# Patient Record
Sex: Male | Born: 1980 | Race: Black or African American | Hispanic: No | Marital: Married
Health system: Southern US, Community
[De-identification: ages and names within clinical notes are randomized; demographics above are authoritative.]

---

## 2003-10-25 ENCOUNTER — Ambulatory Visit (HOSPITAL_BASED_OUTPATIENT_CLINIC_OR_DEPARTMENT_OTHER): Admission: RE | Admit: 2003-10-25 | Discharge: 2003-10-25 | Payer: Self-pay | Admitting: Orthopaedic Surgery

## 2006-02-14 ENCOUNTER — Emergency Department (HOSPITAL_COMMUNITY): Admission: EM | Admit: 2006-02-14 | Discharge: 2006-02-14 | Payer: Self-pay | Admitting: Family Medicine

## 2006-12-20 ENCOUNTER — Emergency Department (HOSPITAL_COMMUNITY): Admission: EM | Admit: 2006-12-20 | Discharge: 2006-12-20 | Payer: Self-pay | Admitting: Emergency Medicine

## 2007-09-17 ENCOUNTER — Emergency Department (HOSPITAL_COMMUNITY): Admission: EM | Admit: 2007-09-17 | Discharge: 2007-09-17 | Payer: Self-pay | Admitting: Emergency Medicine

## 2010-05-18 ENCOUNTER — Encounter: Admission: RE | Admit: 2010-05-18 | Discharge: 2010-05-18 | Payer: Self-pay | Admitting: Family Medicine

## 2010-07-28 ENCOUNTER — Emergency Department (HOSPITAL_COMMUNITY): Admission: EM | Admit: 2010-07-28 | Discharge: 2010-07-28 | Payer: Self-pay | Admitting: Family Medicine

## 2010-09-03 ENCOUNTER — Emergency Department (HOSPITAL_COMMUNITY): Admission: EM | Admit: 2010-09-03 | Discharge: 2010-09-03 | Payer: Self-pay | Admitting: Emergency Medicine

## 2011-05-17 NOTE — Op Note (Signed)
NAME:  Peter Burke, Peter Burke NO.:  [id number]   MEDICAL RECORD NO.:  [id number]                   PATIENT TYPE:  AMB   LOCATION:  [facility]                                  FACILITY:  [facility]   PHYSICIAN:  Lubertha Basque. Jerl Santos, M.D.             DATE OF BIRTH:  10/07/1981   DATE OF PROCEDURE:  10/25/2003  DATE OF DISCHARGE:                                 OPERATIVE REPORT   PREOPERATIVE DIAGNOSIS:  Right foot Lisfranc injury.   POSTOPERATIVE DIAGNOSIS:  Right foot Lisfranc injury.   PROCEDURE:  Right foot closed reduction and pinning.   ANESTHESIA:  General.   ATTENDING SURGEON:  Lubertha Basque. Jerl Santos, M.D.   ASSISTANT:  Lindwood Qua, P.A.   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  He  has injured his mid-foot.  He has subluxation of metatarsals 3, 4, and 5 at  Lisfranc's joint, though metatarsals 2 and 1 appear to be stable.  There is  no associated fracture.  He is offered a closed reduction and pinning in  hopes of realigning his mid-foot and preventing further difficulty in this  portion of his foot.  Informed operative consent was obtained after  discussion of possible complications of reaction to anesthesia, infection,  and degenerative changes.   DESCRIPTION OF PROCEDURE:  The patient was taken to the operating suite  where general anesthetic was applied without difficulty.  He was positioned  supine and prepped and draped in the normal sterile fashion.  After  administration of preoperative IV antibiotics, the right foot was examined  under fluoroscopy.  He was found to have an instability again of metatarsals  3, 4, and 5 in a lateral direction.  This was reduced in an adequate  position and pinned in place percutaneously with two 0.062 K-wires.  These  were placed through the bases of metatarsals 4 and 5 into the mid-foot.  The  pins were  checked under fluoroscopy to be in appropriate position.  I read  all these views myself.  The pins were bent outside the skin and dressed  with Xeroform.  Dry gauze was applied followed by a posterior splint of  plaster.  Estimated blood loss and intraoperative fluids can be obtained  from the anesthesia records.  No tourniquet was placed.   DISPOSITION:  The patient was extubated in the operating room and taken to  the recovery room in stable condition.  Plans were for him to go home the  same day and to follow up in the office in less than a week.  I will contact  him by phone tonight.  Lubertha Basque Jerl Santos, M.D.    PGD/MEDQ  D:  10/25/2003  T:  10/25/2003  Job:  [id number]

## 2015-08-08 ENCOUNTER — Telehealth: Payer: Self-pay | Admitting: Family Medicine

## 2015-11-15 NOTE — Telephone Encounter (Signed)
Finished

## 2015-11-17 ENCOUNTER — Other Ambulatory Visit: Payer: Self-pay | Admitting: Family Medicine

## 2015-11-17 DIAGNOSIS — R1031 Right lower quadrant pain: Secondary | ICD-10-CM

## 2015-11-17 DIAGNOSIS — R197 Diarrhea, unspecified: Secondary | ICD-10-CM

## 2015-11-17 DIAGNOSIS — R110 Nausea: Secondary | ICD-10-CM

## 2015-11-27 ENCOUNTER — Ambulatory Visit
Admission: RE | Admit: 2015-11-27 | Discharge: 2015-11-27 | Disposition: A | Discharge status: Home / Self Care | Payer: BC Managed Care – PPO | Source: Ambulatory Visit | Attending: Family Medicine | Admitting: Family Medicine

## 2015-11-27 DIAGNOSIS — R110 Nausea: Secondary | ICD-10-CM

## 2015-11-27 DIAGNOSIS — R1031 Right lower quadrant pain: Secondary | ICD-10-CM

## 2015-11-27 DIAGNOSIS — R197 Diarrhea, unspecified: Secondary | ICD-10-CM

## 2016-07-10 IMAGING — US US ABDOMEN COMPLETE
1 series · 14 of 25 positions shown · non-contrast
Comparison: None impacts

CLINICAL DATA: Bilateral lower abdominal discomfort associated with
nausea vomiting and diarrhea for the past year

EXAM:
ULTRASOUND ABDOMEN COMPLETE

[Series 1: us abdomen complete · 0.20mm/px · 14 of 81 slices shown]
[im 1/81]
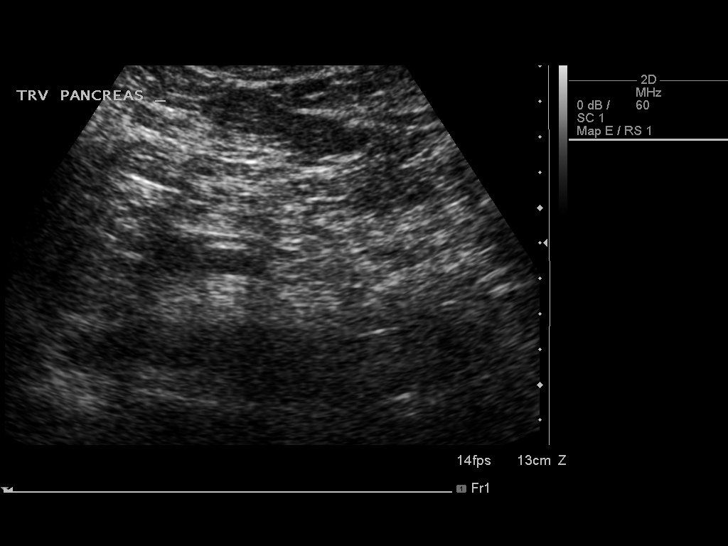
[im 7/81]
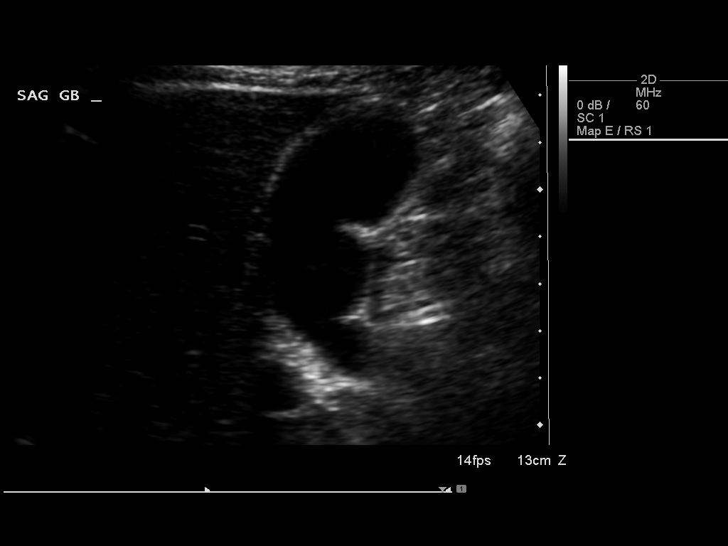
[im 14/81]
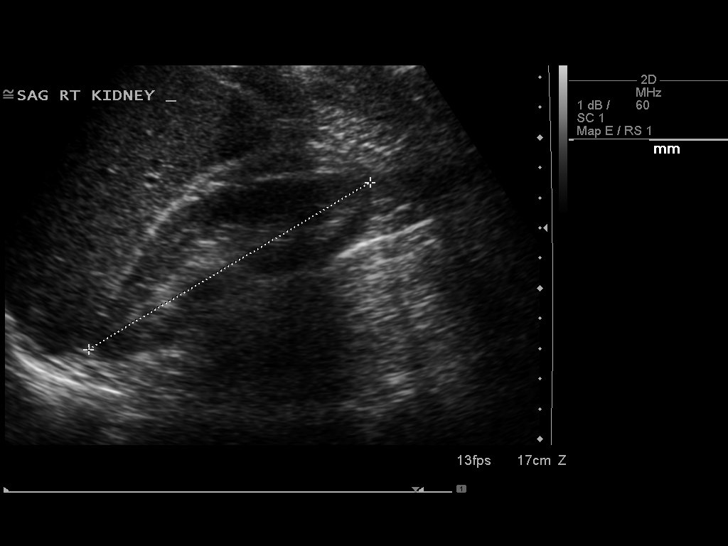
[im 21/81]
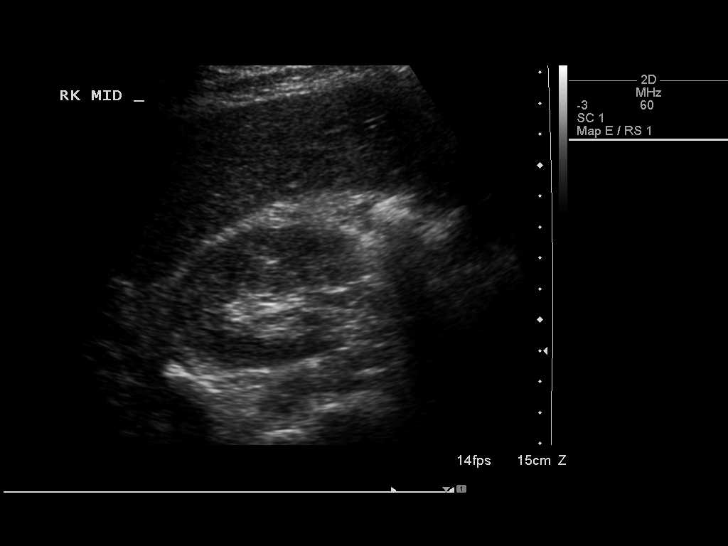
[im 27/81]
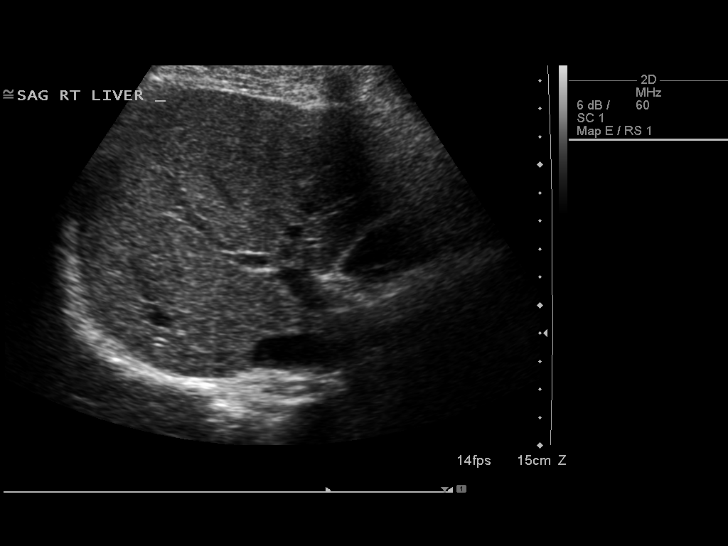
[im 31/81]
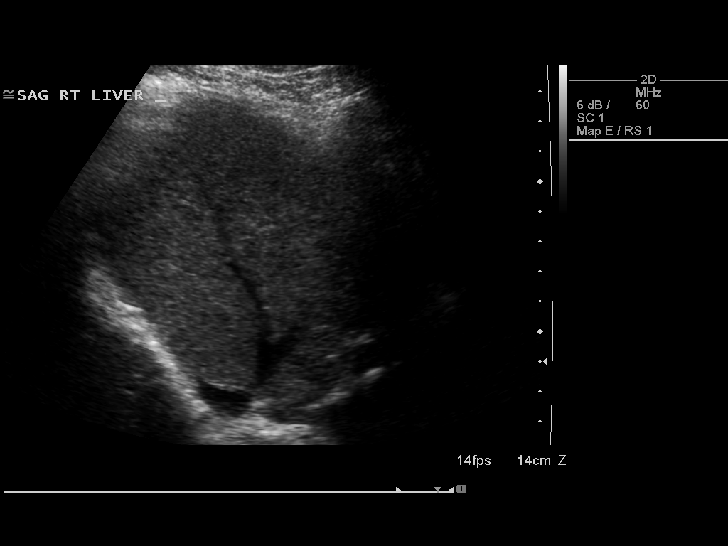
[im 37/81]
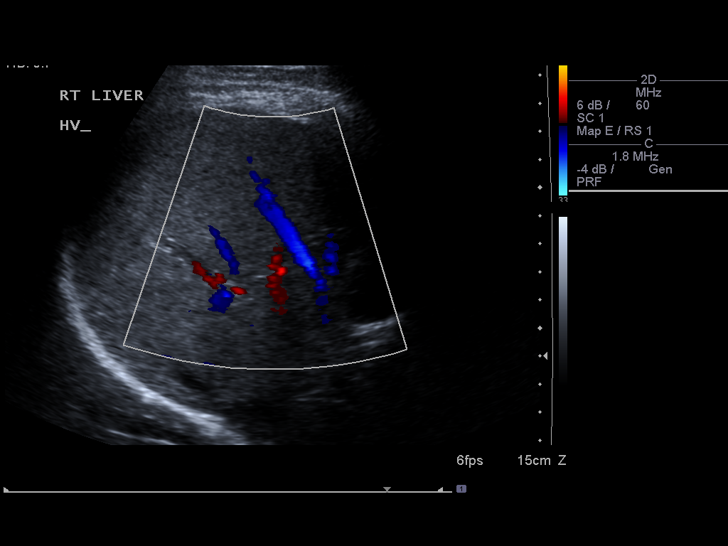
[im 44/81]
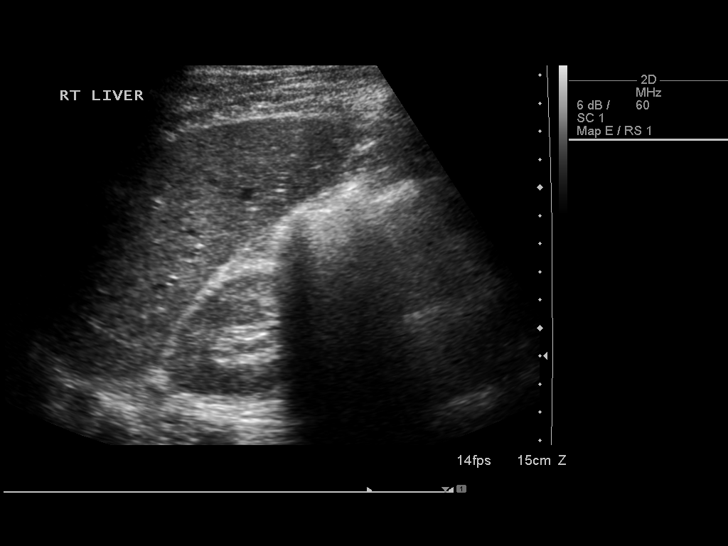
[im 51/81]
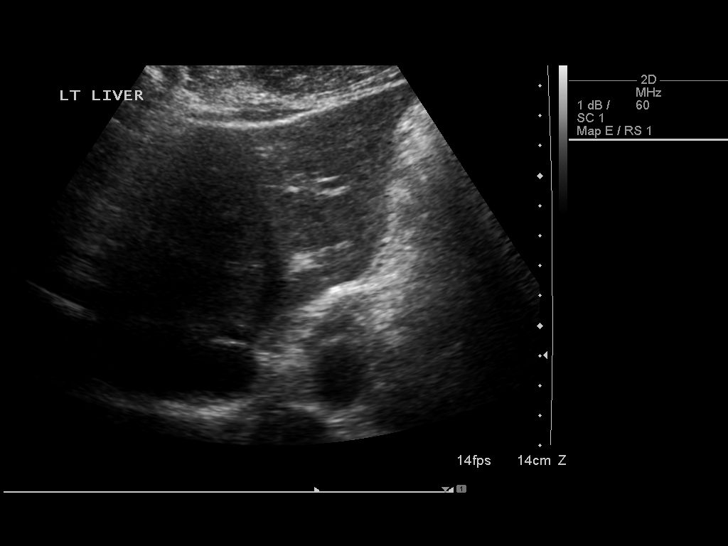
[im 54/81]
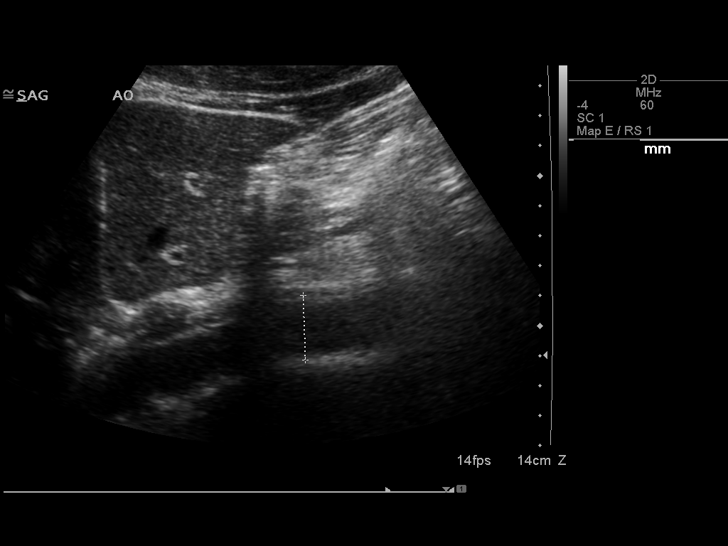
[im 61/81]
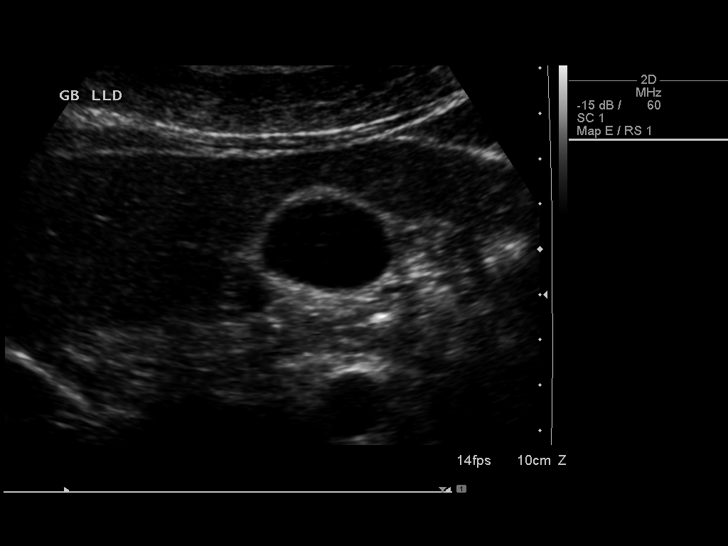
[im 67/81]
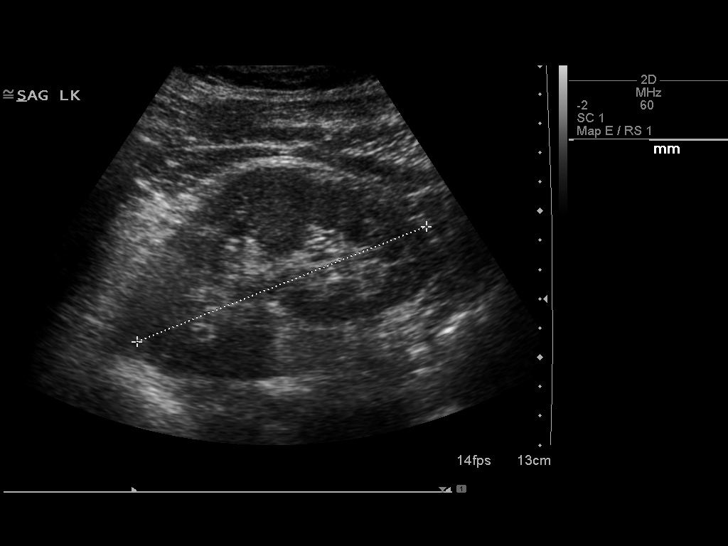
[im 74/81]
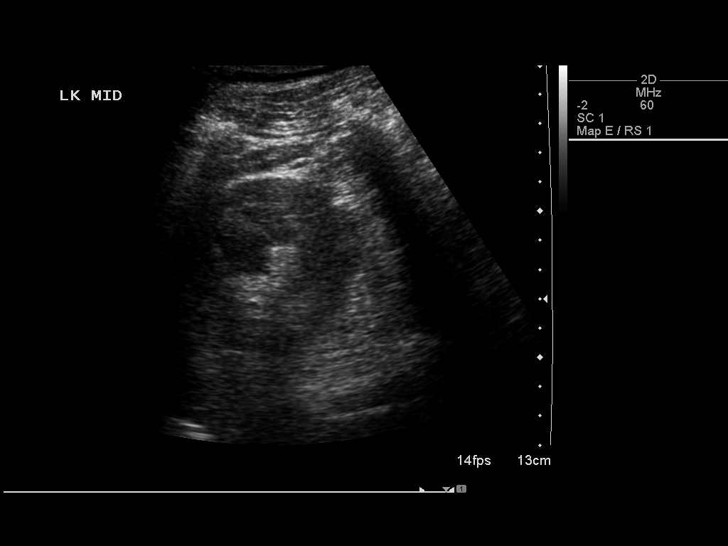
[im 81/81]
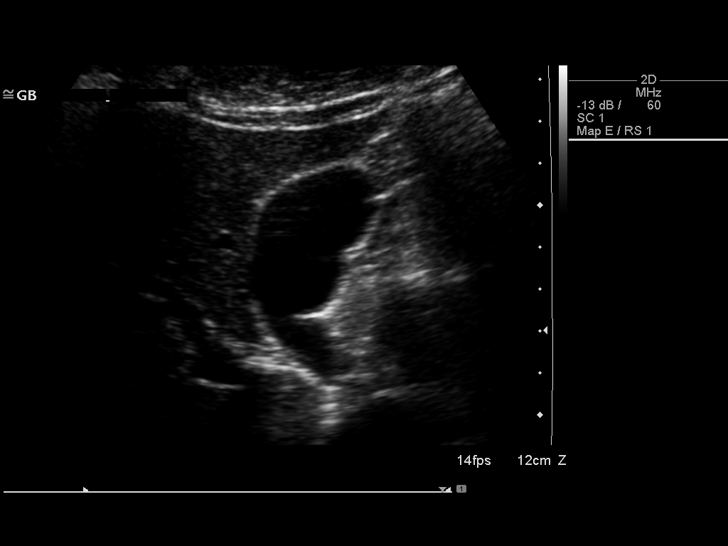

[14 of 25 positions shown; findings below may reference images not displayed]

FINDINGS: Gallbladder: No gallstones or wall thickening visualized. No
sonographic Murphy sign noted.

Common bile duct: Diameter: 3.5 mm

Liver: No focal lesion identified. Within normal limits in
parenchymal echogenicity.

IVC: No abnormality visualized.

Pancreas: Evaluation of the pancreatic tail is limited by bowel gas.

Spleen: Size and appearance within normal limits.

Right Kidney: Length: 10.9 cm. Echogenicity within normal limits. No
mass or hydronephrosis visualized.

Left Kidney: Length: 10.6 cm. Echogenicity within normal limits. No
mass or hydronephrosis visualized.

Abdominal aorta: The proximal and distal aorta exhibit normal
caliber. The mid aorta is obscured by bowel gas.

Other findings: None.
IMPRESSION: 1. Normal appearance of the liver, common bile duct, gallbladder,
and visualized portions of the pancreas.
2. Limited visualization of the mid abdominal aorta.
3. No acute intra-abdominal abnormality is observed.
